# Patient Record
Sex: Female | Born: 1965 | Race: White | Hispanic: No | Marital: Married | Smoking: Former smoker
Health system: Southern US, Community
[De-identification: ages and names within clinical notes are randomized; demographics above are authoritative.]

## PROBLEM LIST (undated history)

## (undated) DIAGNOSIS — G4730 Sleep apnea, unspecified: Secondary | ICD-10-CM

## (undated) DIAGNOSIS — E119 Type 2 diabetes mellitus without complications: Secondary | ICD-10-CM

## (undated) DIAGNOSIS — I10 Essential (primary) hypertension: Secondary | ICD-10-CM

## (undated) DIAGNOSIS — IMO0001 Reserved for inherently not codable concepts without codable children: Secondary | ICD-10-CM

## (undated) DIAGNOSIS — F419 Anxiety disorder, unspecified: Secondary | ICD-10-CM

## (undated) HISTORY — PX: APPENDECTOMY: SHX54

---

## 2010-11-09 ENCOUNTER — Other Ambulatory Visit (HOSPITAL_COMMUNITY)
Admission: RE | Admit: 2010-11-09 | Discharge: 2010-11-09 | Disposition: A | Discharge status: Home / Self Care | Payer: 59 | Source: Ambulatory Visit | Attending: Family Medicine | Admitting: Family Medicine

## 2010-11-09 DIAGNOSIS — Z124 Encounter for screening for malignant neoplasm of cervix: Secondary | ICD-10-CM | POA: Insufficient documentation

## 2013-11-15 ENCOUNTER — Other Ambulatory Visit (HOSPITAL_COMMUNITY)
Admission: RE | Admit: 2013-11-15 | Discharge: 2013-11-15 | Disposition: A | Discharge status: Home / Self Care | Payer: 59 | Source: Ambulatory Visit | Attending: Family Medicine | Admitting: Family Medicine

## 2013-11-15 ENCOUNTER — Other Ambulatory Visit: Payer: Self-pay | Admitting: Family Medicine

## 2013-11-15 DIAGNOSIS — Z1151 Encounter for screening for human papillomavirus (HPV): Secondary | ICD-10-CM | POA: Insufficient documentation

## 2013-11-15 DIAGNOSIS — Z124 Encounter for screening for malignant neoplasm of cervix: Secondary | ICD-10-CM | POA: Insufficient documentation

## 2014-01-09 ENCOUNTER — Ambulatory Visit: Payer: 59

## 2014-01-16 ENCOUNTER — Ambulatory Visit: Payer: 59

## 2014-01-23 ENCOUNTER — Ambulatory Visit: Payer: 59

## 2015-06-18 ENCOUNTER — Other Ambulatory Visit: Payer: Self-pay | Admitting: Family Medicine

## 2015-06-18 ENCOUNTER — Other Ambulatory Visit (HOSPITAL_COMMUNITY)
Admission: RE | Admit: 2015-06-18 | Discharge: 2015-06-18 | Disposition: A | Discharge status: Home / Self Care | Payer: Managed Care, Other (non HMO) | Source: Ambulatory Visit | Attending: Family Medicine | Admitting: Family Medicine

## 2015-06-18 DIAGNOSIS — Z01411 Encounter for gynecological examination (general) (routine) with abnormal findings: Secondary | ICD-10-CM | POA: Insufficient documentation

## 2015-06-23 ENCOUNTER — Other Ambulatory Visit: Payer: Self-pay | Admitting: Family Medicine

## 2015-06-23 DIAGNOSIS — D259 Leiomyoma of uterus, unspecified: Secondary | ICD-10-CM

## 2015-06-23 LAB — CYTOLOGY - PAP

## 2015-06-25 ENCOUNTER — Ambulatory Visit
Admission: RE | Admit: 2015-06-25 | Discharge: 2015-06-25 | Disposition: A | Discharge status: Home / Self Care | Payer: Managed Care, Other (non HMO) | Source: Ambulatory Visit | Attending: Family Medicine | Admitting: Family Medicine

## 2015-06-25 DIAGNOSIS — D259 Leiomyoma of uterus, unspecified: Secondary | ICD-10-CM

## 2016-01-07 NOTE — Patient Instructions (Addendum)
Your procedure is scheduled on:  Friday, January 22, 2016  Enter through the [employer] [facility] at: 11:00 AM  Pick up the phone at the desk and dial [phone].  Call this number if you have problems the morning of surgery: [phone].  Remember: Do NOT eat food:  After midnight Thursday, January 21, 2016  Do NOT drink clear liquids after: 8:30 AM day of surgery  Take these medicines the morning of surgery with a SIP OF WATER:  Do NOT wear jewelry (body piercing), metal hair clips/bobby pins, make-up, or nail polish. Do NOT wear lotions, powders, or perfumes.  You may wear deodorant. Do NOT shave for 48 hours prior to surgery. Do NOT bring valuables to the hospital. Contacts, dentures, or bridgework may not be worn into surgery.  Have a responsible adult drive you home and stay with you for 24 hours after your procedure

## 2016-01-11 ENCOUNTER — Other Ambulatory Visit: Payer: Self-pay

## 2016-01-11 ENCOUNTER — Encounter (HOSPITAL_COMMUNITY)
Admission: RE | Admit: 2016-01-11 | Discharge: 2016-01-11 | Disposition: A | Discharge status: Home / Self Care | Payer: Managed Care, Other (non HMO) | Source: Ambulatory Visit | Attending: Obstetrics and Gynecology | Admitting: Obstetrics and Gynecology

## 2016-01-11 ENCOUNTER — Other Ambulatory Visit: Payer: Self-pay | Admitting: Obstetrics and Gynecology

## 2016-01-11 ENCOUNTER — Encounter (HOSPITAL_COMMUNITY): Payer: Self-pay

## 2016-01-11 DIAGNOSIS — Z01810 Encounter for preprocedural cardiovascular examination: Secondary | ICD-10-CM | POA: Diagnosis present

## 2016-01-11 DIAGNOSIS — Z01812 Encounter for preprocedural laboratory examination: Secondary | ICD-10-CM | POA: Diagnosis present

## 2016-01-11 HISTORY — DX: Essential (primary) hypertension: I10

## 2016-01-11 HISTORY — DX: Type 2 diabetes mellitus without complications: E11.9

## 2016-01-11 HISTORY — DX: Reserved for inherently not codable concepts without codable children: IMO0001

## 2016-01-11 HISTORY — DX: Sleep apnea, unspecified: G47.30

## 2016-01-11 HISTORY — DX: Anxiety disorder, unspecified: F41.9

## 2016-01-11 LAB — BASIC METABOLIC PANEL
Anion gap: 9 (ref 5–15)
BUN: 11 mg/dL (ref 6–20)
CO2: 27 mmol/L (ref 22–32)
CREATININE: 0.65 mg/dL (ref 0.44–1.00)
Calcium: 8.7 mg/dL — ABNORMAL LOW (ref 8.9–10.3)
Chloride: 99 mmol/L — ABNORMAL LOW (ref 101–111)
GFR calc Af Amer: >60 mL/min (ref >60)
GLUCOSE: 190 mg/dL — AB (ref 65–99)
POTASSIUM: 3 mmol/L — AB (ref 3.5–5.1)
SODIUM: 135 mmol/L (ref 135–145)

## 2016-01-11 LAB — CBC
HEMATOCRIT: 35.8 % — AB (ref 36.0–46.0)
Hemoglobin: 11.8 g/dL — ABNORMAL LOW (ref 12.0–15.0)
MCH: 28.7 pg (ref 26.0–34.0)
MCHC: 33 g/dL (ref 30.0–36.0)
MCV: 87.1 fL (ref 78.0–100.0)
PLATELETS: 267 10*3/uL (ref 150–400)
RBC: 4.11 MIL/uL (ref 3.87–5.11)
RDW: 14.1 % (ref 11.5–15.5)
WBC: 6.5 10*3/uL (ref 4.0–10.5)

## 2016-01-11 NOTE — Pre-Procedure Instructions (Signed)
BMP results routed to Dr. Harrington Challenger- made mention on route note that K+ result is low.

## 2016-01-11 NOTE — Pre-Procedure Instructions (Signed)
Dr. Ermalene Postin notified about patient's K+ result of 3.0 and he is ok with that- "anything above 2.5 is fine"

## 2016-01-18 NOTE — Anesthesia Preprocedure Evaluation (Addendum)
Anesthesia Evaluation  Patient identified by MRN, date of birth, ID band Patient awake    Reviewed: Allergy & Precautions, NPO status , Patient's Chart, lab work & pertinent test results  Airway Mallampati: II   Neck ROM: Full    Dental  (+) Teeth Intact, Dental Advisory Given   Pulmonary neg pulmonary ROS, sleep apnea , former smoker (quit 1986),    breath sounds clear to auscultation       Cardiovascular hypertension, Pt. on medications negative cardio ROS   Rhythm:Regular     Neuro/Psych Anxiety negative neurological ROS  negative psych ROS   GI/Hepatic negative GI ROS, Neg liver ROS,   Endo/Other  negative endocrine ROSdiabetesMorbid obesityBMI 41  Renal/GU negative Renal ROS  negative genitourinary   Musculoskeletal negative musculoskeletal ROS (+)   Abdominal   Peds negative pediatric ROS (+)  Hematology negative hematology ROS (+)   Anesthesia Other Findings   Reproductive/Obstetrics negative OB ROS                            Anesthesia Physical Anesthesia Plan  ASA: II  Anesthesia Plan: General   Post-op Pain Management:    Induction: Intravenous  Airway Management Planned: LMA and Oral ETT  Additional Equipment:   Intra-op Plan:   Post-operative Plan: Extubation in OR  Informed Consent: I have reviewed the patients History and Physical, chart, labs and discussed the procedure including the risks, benefits and alternatives for the proposed anesthesia with the patient or authorized representative who has indicated his/her understanding and acceptance.     Plan Discussed with:   Anesthesia Plan Comments: (Check am potassium  )        Anesthesia Quick Evaluation

## 2016-01-22 ENCOUNTER — Other Ambulatory Visit: Payer: Self-pay | Admitting: Obstetrics and Gynecology

## 2016-01-22 ENCOUNTER — Ambulatory Visit (HOSPITAL_COMMUNITY): Payer: Managed Care, Other (non HMO) | Admitting: Anesthesiology

## 2016-01-22 ENCOUNTER — Ambulatory Visit (HOSPITAL_COMMUNITY)
Admission: RE | Admit: 2016-01-22 | Discharge: 2016-01-22 | Disposition: A | Discharge status: Home / Self Care | Payer: Managed Care, Other (non HMO) | Source: Ambulatory Visit | Attending: Obstetrics and Gynecology | Admitting: Obstetrics and Gynecology

## 2016-01-22 ENCOUNTER — Encounter (HOSPITAL_COMMUNITY): Payer: Self-pay

## 2016-01-22 ENCOUNTER — Encounter (HOSPITAL_COMMUNITY)
Admission: RE | Disposition: A | Discharge status: Home / Self Care | Payer: Self-pay | Source: Ambulatory Visit | Attending: Obstetrics and Gynecology

## 2016-01-22 DIAGNOSIS — G4730 Sleep apnea, unspecified: Secondary | ICD-10-CM | POA: Diagnosis not present

## 2016-01-22 DIAGNOSIS — Z6841 Body Mass Index (BMI) 40.0 and over, adult: Secondary | ICD-10-CM | POA: Insufficient documentation

## 2016-01-22 DIAGNOSIS — Z87891 Personal history of nicotine dependence: Secondary | ICD-10-CM | POA: Diagnosis not present

## 2016-01-22 DIAGNOSIS — I10 Essential (primary) hypertension: Secondary | ICD-10-CM | POA: Insufficient documentation

## 2016-01-22 DIAGNOSIS — F419 Anxiety disorder, unspecified: Secondary | ICD-10-CM | POA: Diagnosis not present

## 2016-01-22 DIAGNOSIS — E119 Type 2 diabetes mellitus without complications: Secondary | ICD-10-CM | POA: Insufficient documentation

## 2016-01-22 DIAGNOSIS — N921 Excessive and frequent menstruation with irregular cycle: Secondary | ICD-10-CM | POA: Insufficient documentation

## 2016-01-22 HISTORY — PX: HYSTEROSCOPY: SHX211

## 2016-01-22 LAB — BASIC METABOLIC PANEL
Anion gap: 8 (ref 5–15)
BUN: 13 mg/dL (ref 6–20)
CHLORIDE: 98 mmol/L — AB (ref 101–111)
CO2: 27 mmol/L (ref 22–32)
CREATININE: 0.62 mg/dL (ref 0.44–1.00)
Calcium: 8.9 mg/dL (ref 8.9–10.3)
GFR calc non Af Amer: >60 mL/min (ref >60)
Glucose, Bld: 103 mg/dL — ABNORMAL HIGH (ref 65–99)
POTASSIUM: 3.6 mmol/L (ref 3.5–5.1)
SODIUM: 133 mmol/L — AB (ref 135–145)

## 2016-01-22 LAB — GLUCOSE, CAPILLARY: Glucose-Capillary: 90 mg/dL (ref 65–99)

## 2016-01-22 SURGERY — HYSTEROSCOPY
Anesthesia: General

## 2016-01-22 MED ORDER — KETOROLAC TROMETHAMINE 30 MG/ML IJ SOLN
30.0000 mg | Freq: Once | INTRAMUSCULAR | Status: AC
  Administered 2016-01-22: 30 mg via INTRAMUSCULAR

## 2016-01-22 MED ORDER — LIDOCAINE HCL (CARDIAC) 20 MG/ML IV SOLN
INTRAVENOUS | Status: AC
  Filled 2016-01-22: qty 5

## 2016-01-22 MED ORDER — SODIUM CHLORIDE 0.9 % IR SOLN
Status: DC | PRN
  Administered 2016-01-22 (×2): 3000 mL

## 2016-01-22 MED ORDER — HYDROCODONE-ACETAMINOPHEN 5-325 MG PO TABS
ORAL_TABLET | ORAL | Status: DC
Start: 2016-01-22 — End: 2016-01-22
  Filled 2016-01-22: qty 1

## 2016-01-22 MED ORDER — FENTANYL CITRATE (PF) 100 MCG/2ML IJ SOLN
INTRAMUSCULAR | Status: AC
  Filled 2016-01-22: qty 2

## 2016-01-22 MED ORDER — KETOROLAC TROMETHAMINE 30 MG/ML IJ SOLN
INTRAMUSCULAR | Status: DC | PRN
  Administered 2016-01-22: 30 mg via INTRAVENOUS

## 2016-01-22 MED ORDER — EPHEDRINE SULFATE 50 MG/ML IJ SOLN
INTRAMUSCULAR | Status: DC | PRN
  Administered 2016-01-22: 10 mg via INTRAVENOUS
  Administered 2016-01-22: 15 mg via INTRAVENOUS

## 2016-01-22 MED ORDER — ONDANSETRON HCL 4 MG/2ML IJ SOLN
INTRAMUSCULAR | Status: AC
  Filled 2016-01-22: qty 2

## 2016-01-22 MED ORDER — ACETAMINOPHEN 500 MG PO TABS
500.0000 mg | ORAL_TABLET | Freq: Once | ORAL | Status: AC
  Administered 2016-01-22: 500 mg via ORAL

## 2016-01-22 MED ORDER — MEPERIDINE HCL 25 MG/ML IJ SOLN: 6.2500 mg | INTRAMUSCULAR | Status: DC | PRN

## 2016-01-22 MED ORDER — DEXAMETHASONE SODIUM PHOSPHATE 10 MG/ML IJ SOLN
INTRAMUSCULAR | Status: DC | PRN
  Administered 2016-01-22: 4 mg via INTRAVENOUS

## 2016-01-22 MED ORDER — FENTANYL CITRATE (PF) 100 MCG/2ML IJ SOLN
25.0000 ug | INTRAMUSCULAR | Status: DC | PRN
  Administered 2016-01-22 (×3): 50 ug via INTRAVENOUS

## 2016-01-22 MED ORDER — HYDROCODONE-ACETAMINOPHEN 5-325 MG PO TABS: ORAL_TABLET | ORAL | Status: AC

## 2016-01-22 MED ORDER — MIDAZOLAM HCL 2 MG/2ML IJ SOLN
INTRAMUSCULAR | Status: DC | PRN
  Administered 2016-01-22: 2 mg via INTRAVENOUS

## 2016-01-22 MED ORDER — DEXAMETHASONE SODIUM PHOSPHATE 4 MG/ML IJ SOLN
INTRAMUSCULAR | Status: AC
  Filled 2016-01-22: qty 1

## 2016-01-22 MED ORDER — ONDANSETRON HCL 4 MG/2ML IJ SOLN
INTRAMUSCULAR | Status: DC | PRN
Start: 2016-01-22 — End: 2016-01-22
  Administered 2016-01-22: 4 mg via INTRAVENOUS

## 2016-01-22 MED ORDER — SCOPOLAMINE 1 MG/3DAYS TD PT72
1.0000 | MEDICATED_PATCH | Freq: Once | TRANSDERMAL | Status: DC
  Administered 2016-01-22: 1.5 mg via TRANSDERMAL

## 2016-01-22 MED ORDER — FENTANYL CITRATE (PF) 100 MCG/2ML IJ SOLN
INTRAMUSCULAR | Status: DC | PRN
  Administered 2016-01-22 (×2): 50 ug via INTRAVENOUS

## 2016-01-22 MED ORDER — FENTANYL CITRATE (PF) 100 MCG/2ML IJ SOLN
INTRAMUSCULAR | Status: AC
  Administered 2016-01-22: 50 ug via INTRAVENOUS
  Filled 2016-01-22: qty 2

## 2016-01-22 MED ORDER — LIDOCAINE HCL 1 % IJ SOLN
INTRAMUSCULAR | Status: AC
  Filled 2016-01-22: qty 20

## 2016-01-22 MED ORDER — IBUPROFEN 600 MG PO TABS: 600.0000 mg | ORAL_TABLET | Freq: Four times a day (QID) | ORAL | Status: AC | PRN

## 2016-01-22 MED ORDER — PROPOFOL 10 MG/ML IV BOLUS
INTRAVENOUS | Status: AC
  Filled 2016-01-22: qty 20

## 2016-01-22 MED ORDER — LIDOCAINE HCL (CARDIAC) 20 MG/ML IV SOLN
INTRAVENOUS | Status: DC | PRN
  Administered 2016-01-22: 30 mg via INTRAVENOUS
  Administered 2016-01-22: 70 mg via INTRAVENOUS

## 2016-01-22 MED ORDER — HYDROCODONE-ACETAMINOPHEN 5-325 MG PO TABS
1.0000 | ORAL_TABLET | Freq: Once | ORAL | Status: AC
  Administered 2016-01-22: 1 via ORAL

## 2016-01-22 MED ORDER — PROPOFOL 10 MG/ML IV BOLUS
INTRAVENOUS | Status: DC | PRN
  Administered 2016-01-22: 170 mg via INTRAVENOUS

## 2016-01-22 MED ORDER — KETOROLAC TROMETHAMINE 30 MG/ML IJ SOLN
INTRAMUSCULAR | Status: AC
  Filled 2016-01-22: qty 1

## 2016-01-22 MED ORDER — LIDOCAINE HCL 1 % IJ SOLN
INTRAMUSCULAR | Status: DC | PRN
  Administered 2016-01-22: 10 mL

## 2016-01-22 MED ORDER — PROMETHAZINE HCL 25 MG/ML IJ SOLN
6.2500 mg | INTRAMUSCULAR | Status: DC | PRN
Start: 2016-01-22 — End: 2016-01-22

## 2016-01-22 MED ORDER — MIDAZOLAM HCL 2 MG/2ML IJ SOLN
INTRAMUSCULAR | Status: AC
  Filled 2016-01-22: qty 2

## 2016-01-22 MED ORDER — ACETAMINOPHEN 500 MG PO TABS
ORAL_TABLET | ORAL | Status: AC
  Administered 2016-01-22: 500 mg via ORAL
  Filled 2016-01-22: qty 1

## 2016-01-22 MED ORDER — LACTATED RINGERS IV SOLN
INTRAVENOUS | Status: DC
  Administered 2016-01-22 (×2): via INTRAVENOUS

## 2016-01-22 MED ORDER — SCOPOLAMINE 1 MG/3DAYS TD PT72
MEDICATED_PATCH | TRANSDERMAL | Status: AC
  Filled 2016-01-22: qty 1

## 2016-01-22 MED ORDER — LACTATED RINGERS IV SOLN: INTRAVENOUS | Status: DC

## 2016-01-22 MED ORDER — KETOROLAC TROMETHAMINE 30 MG/ML IJ SOLN
INTRAMUSCULAR | Status: AC
  Administered 2016-01-22: 30 mg via INTRAMUSCULAR
  Filled 2016-01-22: qty 1

## 2016-01-22 MED ORDER — EPHEDRINE 5 MG/ML INJ
INTRAVENOUS | Status: AC
  Filled 2016-01-22: qty 10

## 2016-01-22 SURGICAL SUPPLY — 24 items
ABLATOR ENDOMETRIAL BIPOLAR (ABLATOR) IMPLANT
CANISTER SUCT 3000ML (MISCELLANEOUS) ×3 IMPLANT
CATH FOLEY LATEX FREE 14FR (CATHETERS) ×2
CATH FOLEY LF 14FR (CATHETERS) ×1 IMPLANT
CATH ROBINSON RED A/P 16FR (CATHETERS) IMPLANT
CLOTH BEACON ORANGE TIMEOUT ST (SAFETY) ×3 IMPLANT
CONTAINER PREFILL 10% NBF 60ML (FORM) ×6 IMPLANT
DILATOR CANAL MILEX (MISCELLANEOUS) ×3 IMPLANT
ELECT REM PT RETURN 9FT ADLT (ELECTROSURGICAL)
ELECTRODE REM PT RTRN 9FT ADLT (ELECTROSURGICAL) IMPLANT
GLOVE BIO SURGEON STRL SZ7 (GLOVE) IMPLANT
GLOVE BIOGEL PI IND STRL 7.0 (GLOVE) ×2 IMPLANT
GLOVE BIOGEL PI INDICATOR 7.0 (GLOVE) ×4
GLOVE SKINSENSE NS SZ7.0 (GLOVE) ×4
GLOVE SKINSENSE STRL SZ7.0 (GLOVE) ×2 IMPLANT
GOWN STRL REUS W/TWL LRG LVL3 (GOWN DISPOSABLE) ×9 IMPLANT
NS IRRIG 1000ML POUR BTL (IV SOLUTION) ×3 IMPLANT
PACK VAGINAL MINOR WOMEN LF (CUSTOM PROCEDURE TRAY) ×3 IMPLANT
PAD OB MATERNITY 4.3X12.25 (PERSONAL CARE ITEMS) ×6 IMPLANT
SET GENESYS HTA PROCERVA (MISCELLANEOUS) ×3 IMPLANT
TOWEL OR 17X24 6PK STRL BLUE (TOWEL DISPOSABLE) ×6 IMPLANT
TUBING AQUILEX INFLOW (TUBING) ×3 IMPLANT
TUBING AQUILEX OUTFLOW (TUBING) ×3 IMPLANT
WATER STERILE IRR 1000ML POUR (IV SOLUTION) IMPLANT

## 2016-01-22 NOTE — Op Note (Signed)
Pre-Operative Diagnosis: 1) Menometrorrhagia Postoperative diagnoses:1) Menometrorrhagia Procedure:ysteroscopy, dilation and curettage, hydrothermal endometrial ablation Surgeon: Dr. Vanessa Kick Asst.: None Operative findings: Narrow pubic arch, smooth-walled endometrial cavity without polyps. Bilateral tubal ostia were noted Elongated cervix Fluid deficit 100 cc.  Specimen: endometrial curettings EBL: minimal Procedure: Tina Delacruz is a 50 year old female with a history of painful periods that are sometimes heavy. She has a known posterior intramural fibroid approximately 7 cm. She desired minimally invasive surgical management of er heavy painful periods. Please see the admission H&P for the full history. Due to the patient's pelvic anatomy and narrow pubic arch endometrial sampling could not be obtained in the office. Risks/benefits/alternatives of the procedure were discussed with the patient at length and she wished to proceed. Follow the appropriate informed consent the patient was taken to the operating room where general anesthesia was administered. She was placed in the dorsal lithotomy position. She was appropriately identified in a preoperative timeout procedure prior to initiating the case. He was prepped and draped in the normal sterile fashion. A speculum was placed in the vagina. A single-tooth tenaculum was first used to grasp the anterior lip of the cervix and then a second single-tooth tenaculum was required to grasp the posterior lip of the cervix. A paracervical block with 10 cc of 1% lidocaine was administered. The single-tooth tenaculum was removed from the anterior lip of the cervix and the cervix was serially dilated with Hanks dilators. The hysteroscope was introduced. Due to the patient's posterior uterine fibroid a 45 angle with the hysteroscope was required to enter the endometrial cavity. Once in the endometrial cavity the above findings were noted. Given the cervical length and  what appeared to be an elongated endometrial cavity it was felt that a hydrothermal ablation would have the best success for endometrial ablation. The hydrothermal device was then inserted through the cervix under direct visualization. A cavity assessment was performed and minimal leak of fluid was noted. The hydrothermal device was then initiated and the ablation procedure lasted for 10 minutes and was performed under direct visualization. When complete, an adequate endometrial ablation was noted. The hydrothermal device was removed. The single-tooth tenaculum was removed from the anterior lip of the cervix and the speculum was removed from the vagina. This completed the surgical procedure. The patient was transferred to the PACU following the procedure.

## 2016-01-22 NOTE — H&P (Signed)
Tina Delacruz is an 50 y.o. female.  50 yo presents for hysteroscopy, dilation and curettage for menometrorrhagia. The patient has regular periods that have become more painful over the years. She has a known posterior intramural fibroid. An attempt at sonohystogram was performed in the office however due to anatomy this could not be comfortably performed. THe decision was made to proceed with a hysteroscopy, dilation and curettage and attempt endometrial ablation. R/B/A of the procedure were discussed with the patient at length and she wishes to proceed.   Patient's last menstrual period was 01/09/2016.    Past Medical History  Diagnosis Date  . Diabetes mellitus without complication ([place])     type 2  . Hypertension   . Sleep apnea   . Shortness of breath dyspnea     with bronchitis  . Anxiety     Past Surgical History  Procedure Laterality Date  . Cesarean section      x 2  . Appendectomy      No family history on file.  Social History:  reports that she quit smoking about 31 years ago. Her smoking use included Cigarettes. She does not have any smokeless tobacco history on file. She reports that she does not drink alcohol or use illicit drugs.  Allergies: No Known Allergies   (Not in a hospital admission)  ROS  Last menstrual period 01/09/2016. Physical Exam   AOx3, NAD Normal work of breathing Abd soft  No results found for this or any previous visit (from the past 24 hour(s)).    No results found.  Assessment/Plan: 1) Admit 2) Hysteroscopy, dilation and curettage with attempt at endometrial ablation  Montrelle Eddings H. 01/22/2016, 9:02 AM

## 2016-01-22 NOTE — Anesthesia Procedure Notes (Signed)
Procedure Name: LMA Insertion Date/Time: 01/22/2016 11:51 AM Performed by: Tobin Chad Pre-anesthesia Checklist: Patient identified, Emergency Drugs available, Suction available, Timeout performed and Patient being monitored Patient Re-evaluated:Patient Re-evaluated prior to inductionOxygen Delivery Method: Circle system utilized and Simple face mask Preoxygenation: Pre-oxygenation with 100% oxygen Intubation Type: IV induction Ventilation: Mask ventilation without difficulty LMA Size: 4.0 Grade View: Grade II Number of attempts: 1 Placement Confirmation: positive ETCO2 and breath sounds checked- equal and bilateral Dental Injury: Teeth and Oropharynx as per pre-operative assessment

## 2016-01-22 NOTE — Transfer of Care (Signed)
Immediate Anesthesia Transfer of Care Note  Patient: Tina Delacruz  Procedure(s) Performed: Procedure(s): HYSTEROSCOPY (N/A) HYSTEROSCOPY, D&C, WITH HYDROTHERMAL ABLATION (N/A)  Patient Location: PACU  Anesthesia Type:General  Level of Consciousness: awake, alert , oriented and patient cooperative  Airway & Oxygen Therapy: Patient Spontanous Breathing and Patient connected to nasal cannula oxygen  Post-op Assessment: Report given to RN and Post -op Vital signs reviewed and stable  Post vital signs: Reviewed and stable  Last Vitals:  Filed Vitals:   01/22/16 1111  BP: 132/79  Pulse: 70  Temp: 36.8 C  Resp: 22    Last Pain: There were no vitals filed for this visit.    Patients Stated Pain Goal: 4 ([id number])  Complications: No apparent anesthesia complications

## 2016-01-22 NOTE — Anesthesia Postprocedure Evaluation (Signed)
Anesthesia Post Note  Patient: Tina Delacruz  Procedure(s) Performed: Procedure(s) (LRB): HYSTEROSCOPY (N/A) HYSTEROSCOPY, D&C, WITH HYDROTHERMAL ABLATION (N/A)  Patient location during evaluation: PACU Anesthesia Type: General Level of consciousness: awake and alert Pain management: pain level controlled Vital Signs Assessment: post-procedure vital signs reviewed and stable Respiratory status: spontaneous breathing, nonlabored ventilation, respiratory function stable and patient connected to nasal cannula oxygen Cardiovascular status: blood pressure returned to baseline and stable Postop Assessment: no signs of nausea or vomiting Anesthetic complications: no     Last Vitals:  Filed Vitals:   01/22/16 1111 01/22/16 1253  BP: 132/79 116/58  Pulse: 70 87  Temp: 36.8 C 36.6 C  Resp: 22 14    Last Pain: There were no vitals filed for this visit. Pain Goal: Patients Stated Pain Goal: 4 (01/22/16 1111)               [name]

## 2016-01-22 NOTE — Discharge Instructions (Signed)
DISCHARGE INSTRUCTIONS: HYSTEROSCOPY / ENDOMETRIAL ABLATION The following instructions have been prepared to help you care for yourself upon your return home.  May Remove Scop patch on or before 01/25/16   May take Ibuprofen after 6:10pm today.  May take stool softner while taking narcotic pain medication to prevent constipation.  Drink plenty of water.  Personal hygiene:  Use sanitary pads for vaginal drainage, not tampons.  Shower the day after your procedure.  NO tub baths, pools or Jacuzzis for 2-3 weeks.  Wipe front to back after using the bathroom.  Activity and limitations:  Do NOT drive or operate any equipment for 24 hours. The effects of anesthesia are still present and drowsiness may result.  Do NOT rest in bed all day.  Walking is encouraged.  Walk up and down stairs slowly.  You may resume your normal activity in one to two days or as indicated by your physician. Sexual activity: NO intercourse for at least 2 weeks after the procedure, or as indicated by your Doctor.  Diet: Eat a light meal as desired this evening. You may resume your usual diet tomorrow.  Return to Work: You may resume your work activities in one to two days or as indicated by Marine scientist.  What to expect after your surgery: Expect to have vaginal bleeding/discharge for 2-3 days and spotting for up to 10 days. It is not unusual to have soreness for up to 1-2 weeks. You may have a slight burning sensation when you urinate for the first day. Mild cramps may continue for a couple of days. You may have a regular period in 2-6 weeks.  Call your doctor for any of the following:  Excessive vaginal bleeding or clotting, saturating and changing one pad every hour.  Inability to urinate 6 hours after discharge from hospital.  Pain not relieved by pain medication.  Fever of 100.4 F or greater.  Unusual vaginal discharge or odor.  Return to office _________________Call for an appointment  ___________________ Patients signature: ______________________ Nurses signature ________________________  [facility] [phone]

## 2016-01-28 ENCOUNTER — Encounter (HOSPITAL_COMMUNITY): Payer: Self-pay | Admitting: Obstetrics and Gynecology

## 2018-08-24 DIAGNOSIS — M65342 Trigger finger, left ring finger: Secondary | ICD-10-CM | POA: Insufficient documentation

## 2019-03-15 ENCOUNTER — Other Ambulatory Visit (HOSPITAL_COMMUNITY)
Admission: RE | Admit: 2019-03-15 | Discharge: 2019-03-15 | Disposition: A | Discharge status: Home / Self Care | Payer: Managed Care, Other (non HMO) | Source: Ambulatory Visit | Attending: Family Medicine | Admitting: Family Medicine

## 2019-03-15 ENCOUNTER — Other Ambulatory Visit: Payer: Self-pay | Admitting: Family Medicine

## 2019-03-15 DIAGNOSIS — Z01411 Encounter for gynecological examination (general) (routine) with abnormal findings: Secondary | ICD-10-CM | POA: Diagnosis not present

## 2019-03-19 LAB — CYTOLOGY - PAP: Diagnosis: NEGATIVE

## 2019-07-01 ENCOUNTER — Other Ambulatory Visit: Payer: Self-pay | Admitting: Family Medicine

## 2019-07-01 DIAGNOSIS — K7581 Nonalcoholic steatohepatitis (NASH): Secondary | ICD-10-CM

## 2019-07-09 ENCOUNTER — Other Ambulatory Visit: Payer: Self-pay | Admitting: Family Medicine

## 2019-07-09 DIAGNOSIS — Z1231 Encounter for screening mammogram for malignant neoplasm of breast: Secondary | ICD-10-CM

## 2019-07-26 ENCOUNTER — Ambulatory Visit
Admission: RE | Admit: 2019-07-26 | Discharge: 2019-07-26 | Disposition: A | Discharge status: Home / Self Care | Payer: Managed Care, Other (non HMO) | Source: Ambulatory Visit | Attending: Family Medicine | Admitting: Family Medicine

## 2019-07-26 DIAGNOSIS — K7581 Nonalcoholic steatohepatitis (NASH): Secondary | ICD-10-CM

## 2019-08-19 HISTORY — PX: BREAST BIOPSY: SHX20

## 2019-08-23 ENCOUNTER — Other Ambulatory Visit: Payer: Self-pay

## 2019-08-23 ENCOUNTER — Ambulatory Visit
Admission: RE | Admit: 2019-08-23 | Discharge: 2019-08-23 | Disposition: A | Discharge status: Home / Self Care | Payer: Managed Care, Other (non HMO) | Source: Ambulatory Visit | Attending: Family Medicine | Admitting: Family Medicine

## 2019-08-23 DIAGNOSIS — Z1231 Encounter for screening mammogram for malignant neoplasm of breast: Secondary | ICD-10-CM

## 2019-08-28 ENCOUNTER — Other Ambulatory Visit: Payer: Self-pay | Admitting: Family Medicine

## 2019-08-28 DIAGNOSIS — R928 Other abnormal and inconclusive findings on diagnostic imaging of breast: Secondary | ICD-10-CM

## 2019-09-06 ENCOUNTER — Other Ambulatory Visit: Payer: Self-pay

## 2019-09-06 ENCOUNTER — Ambulatory Visit
Admission: RE | Admit: 2019-09-06 | Discharge: 2019-09-06 | Disposition: A | Discharge status: Home / Self Care | Payer: Managed Care, Other (non HMO) | Source: Ambulatory Visit | Attending: Family Medicine | Admitting: Family Medicine

## 2019-09-06 ENCOUNTER — Other Ambulatory Visit: Payer: Self-pay | Admitting: Family Medicine

## 2019-09-06 DIAGNOSIS — R928 Other abnormal and inconclusive findings on diagnostic imaging of breast: Secondary | ICD-10-CM

## 2019-09-06 DIAGNOSIS — R921 Mammographic calcification found on diagnostic imaging of breast: Secondary | ICD-10-CM

## 2019-09-11 ENCOUNTER — Ambulatory Visit
Admission: RE | Admit: 2019-09-11 | Discharge: 2019-09-11 | Disposition: A | Discharge status: Home / Self Care | Payer: Managed Care, Other (non HMO) | Source: Ambulatory Visit | Attending: Family Medicine | Admitting: Family Medicine

## 2019-09-11 ENCOUNTER — Other Ambulatory Visit: Payer: Self-pay

## 2019-09-11 DIAGNOSIS — R921 Mammographic calcification found on diagnostic imaging of breast: Secondary | ICD-10-CM

## 2020-04-02 IMAGING — MG MM BREAST BX W/ LOC DEV 1ST LESION IMAGE BX SPEC STEREO GUIDE*R*
7 of 11 series · 7 of 19 positions shown · non-contrast
Comparison: Previous exams.
COMPARISON: Previous exams.

Addendum:
CLINICAL DATA: 53-year-old female presenting for stereotactic
biopsy of 2 groups of right breast calcifications.

EXAM:
RIGHT BREAST STEREOTACTIC CORE NEEDLE BIOPSY

[R (1 of 6)]
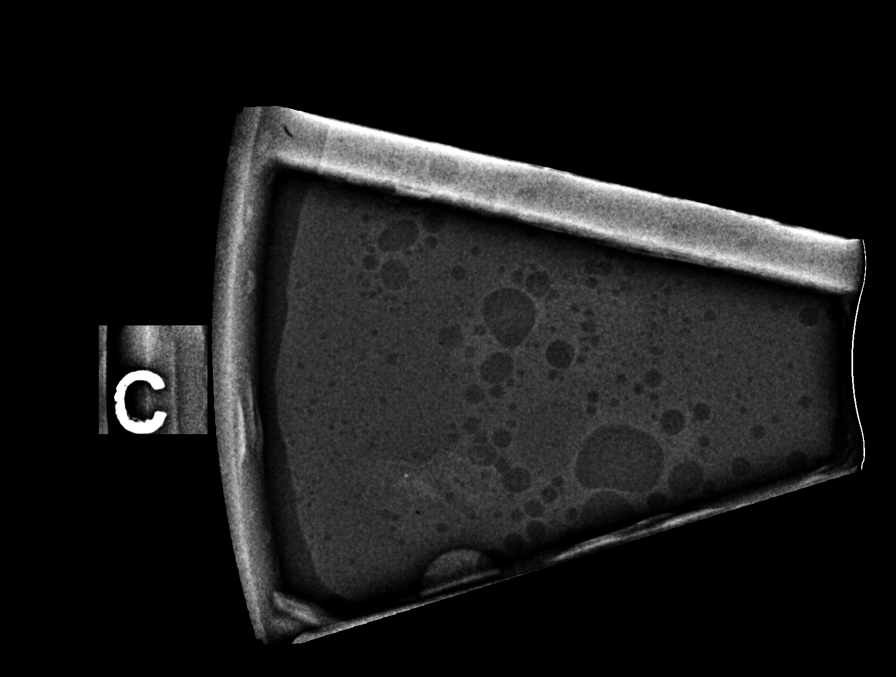

[R (2 of 6)]
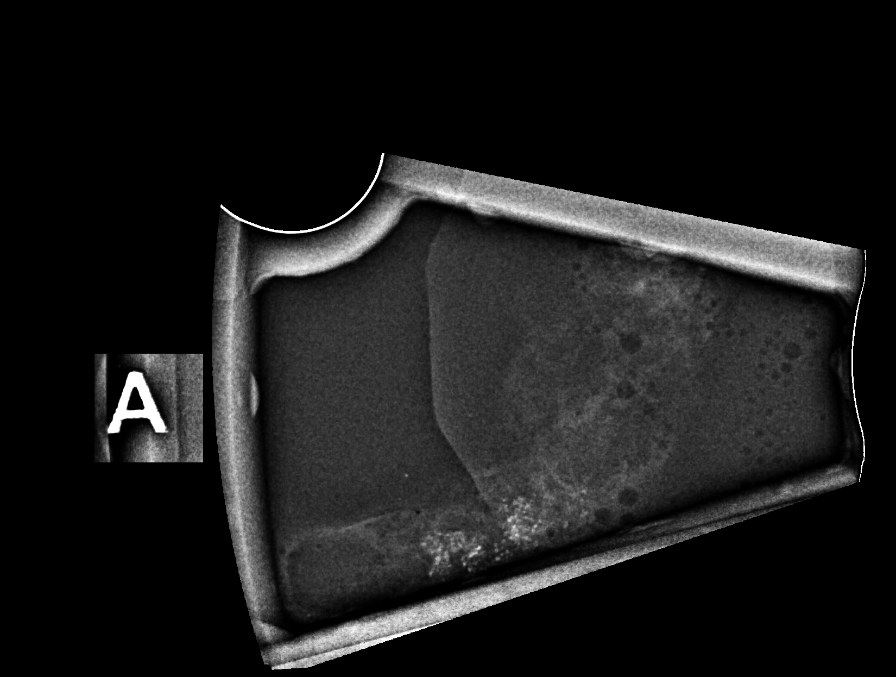

[R (3 of 6)]
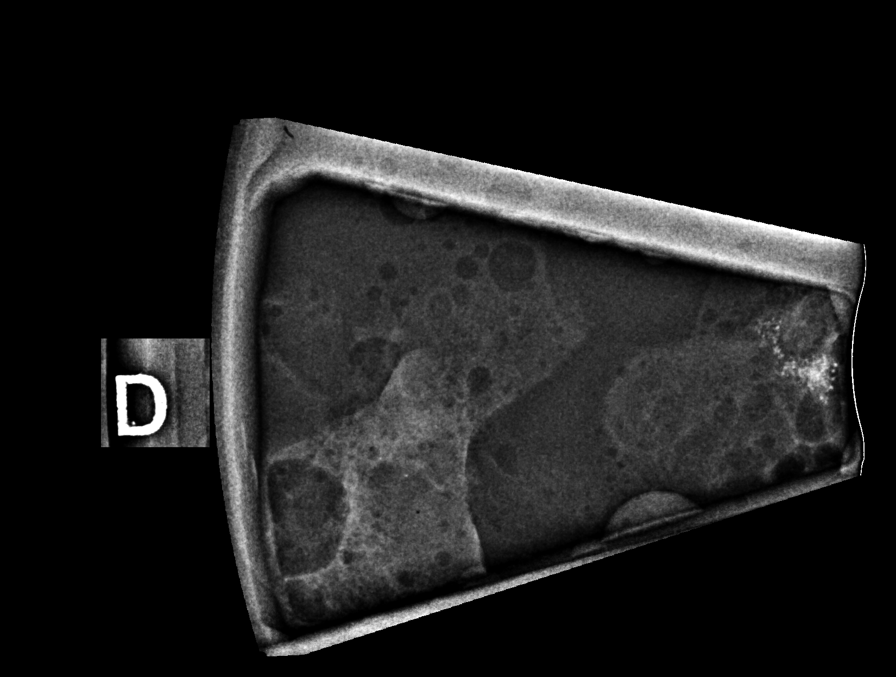

[R (4 of 6)]
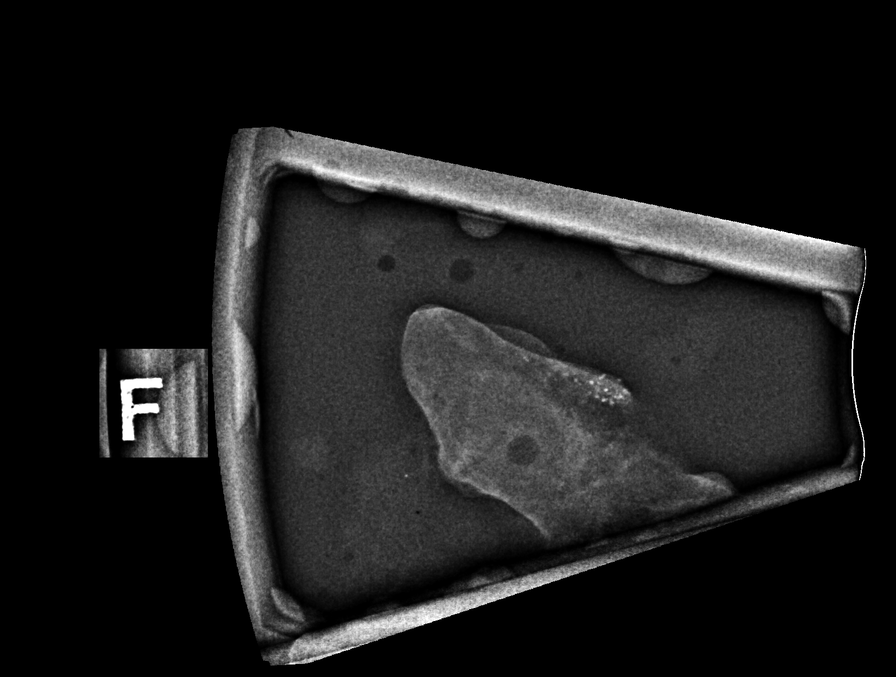

[R (5 of 6)]
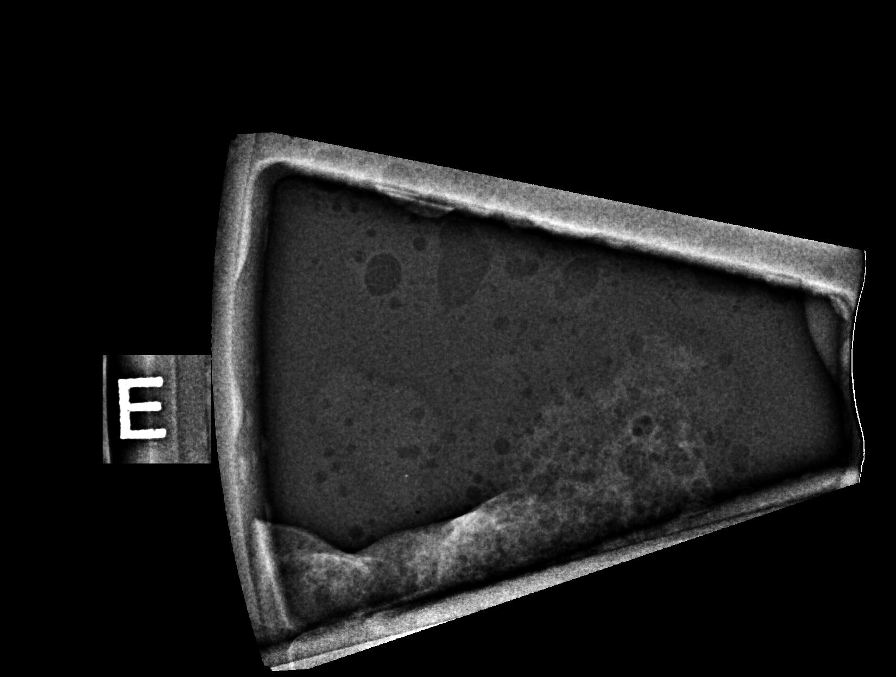

[R (6 of 6)]
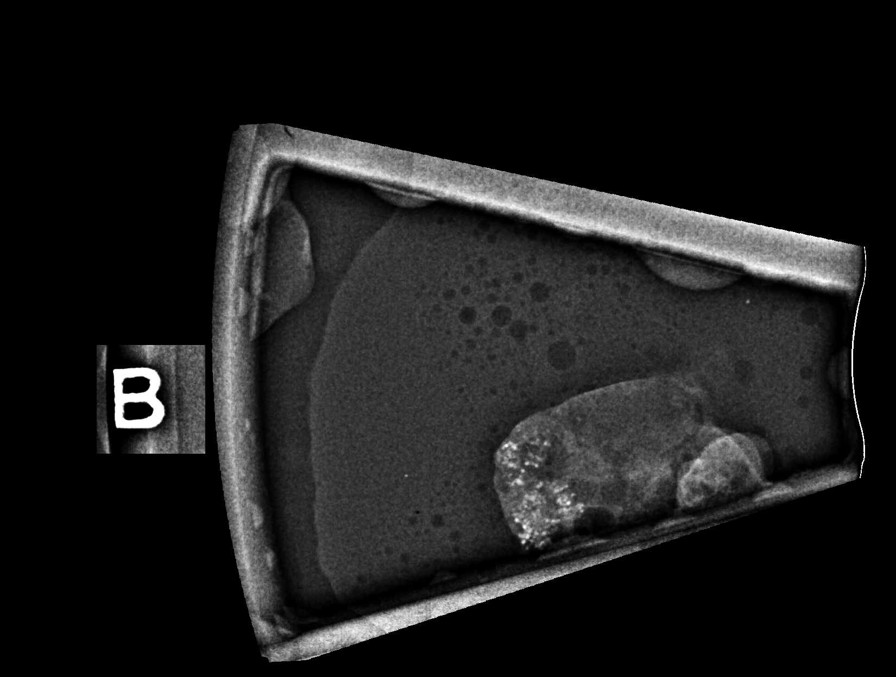

[R CC]
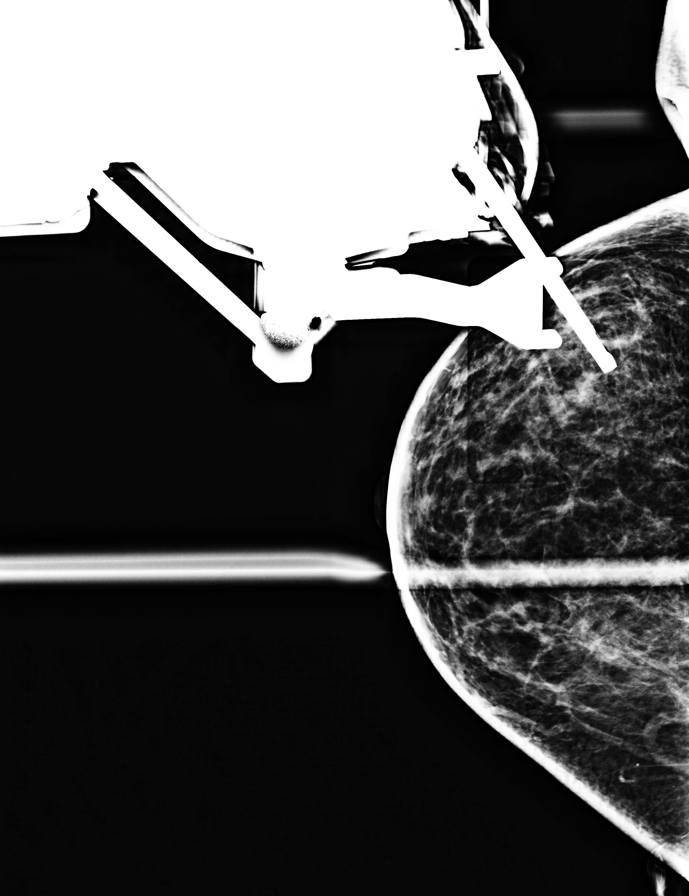

[7 of 19 positions shown; findings below may reference images not displayed]



Using sterile technique and 1% Lidocaine as local anesthetic, under
stereotactic guidance, a 9 gauge vacuum assisted device was used to
perform core needle biopsy of calcifications in the posterior upper
outer quadrant of the right breast using a superior approach.
Specimen radiograph was performed showing calcifications in multiple
core samples. Specimens with calcifications are identified for
pathology.

Lesion quadrant: Upper-outer quadrant

At the conclusion of the procedure, a coil shaped tissue marker clip
was deployed into the biopsy cavity.

----------------------------------------------------------

Using sterile technique and 1% Lidocaine as local anesthetic, under
stereotactic guidance, a 9 gauge vacuum assisted device was used to
perform core needle biopsy of calcifications in the anterior upper
outer quadrant of the right breast using a superior approach.
Specimen radiograph was performed showing calcifications in multiple
core samples. Specimens with calcifications are identified for
pathology.

Lesion quadrant: Upper-outer quadrant

At the conclusion of the procedure, an X shaped tissue marker clip
was deployed into the biopsy cavity. Follow-up 2-view mammogram was
performed and dictated separately.
IMPRESSION: 1. Stereotactic-guided biopsy of calcifications in the posterior
upper outer quadrant of the right breast. No apparent complications.

2. Stereotactic-guided biopsy of calcifications in the anterior
upper outer quadrant of the right breast. No apparent complications.

ADDENDUM:
Pathology revealed FIBROCYSTIC CHANGE WITH COLUMNAR CELL CHANGE AND
CALCIFICATIONS of the Right breast, both locations, posterior upper
outer and anterior upper outer quadrant. This was found to be
concordant by Dr. Abo Ghazi Shawa.

Pathology results were discussed with the patient by telephone. The
patient reported doing well after the biopsies with tenderness at
the sites. Post biopsy instructions and care were reviewed and
questions were answered. The patient was encouraged to call The

The patient was instructed to return for annual screening
mammography and informed a reminder notice would be sent regarding
this appointment.

Pathology results reported by Drea Ji, RN on 09/12/2019.



Using sterile technique and 1% Lidocaine as local anesthetic, under
stereotactic guidance, a 9 gauge vacuum assisted device was used to
perform core needle biopsy of calcifications in the posterior upper
outer quadrant of the right breast using a superior approach.
Specimen radiograph was performed showing calcifications in multiple
core samples. Specimens with calcifications are identified for
pathology.

Lesion quadrant: Upper-outer quadrant

At the conclusion of the procedure, a coil shaped tissue marker clip
was deployed into the biopsy cavity.

----------------------------------------------------------

Using sterile technique and 1% Lidocaine as local anesthetic, under
stereotactic guidance, a 9 gauge vacuum assisted device was used to
perform core needle biopsy of calcifications in the anterior upper
outer quadrant of the right breast using a superior approach.
Specimen radiograph was performed showing calcifications in multiple
core samples. Specimens with calcifications are identified for
pathology.

Lesion quadrant: Upper-outer quadrant

At the conclusion of the procedure, an X shaped tissue marker clip
was deployed into the biopsy cavity. Follow-up 2-view mammogram was
performed and dictated separately.
IMPRESSION: 1. Stereotactic-guided biopsy of calcifications in the posterior
upper outer quadrant of the right breast. No apparent complications.

2. Stereotactic-guided biopsy of calcifications in the anterior
upper outer quadrant of the right breast. No apparent complications.

## 2021-03-20 DIAGNOSIS — I10 Essential (primary) hypertension: Secondary | ICD-10-CM | POA: Insufficient documentation

## 2021-03-20 DIAGNOSIS — E785 Hyperlipidemia, unspecified: Secondary | ICD-10-CM | POA: Insufficient documentation

## 2021-08-06 ENCOUNTER — Other Ambulatory Visit (HOSPITAL_COMMUNITY): Payer: Self-pay

## 2021-08-06 MED ORDER — TRULICITY 4.5 MG/0.5ML ~~LOC~~ SOAJ
4.5000 mg | SUBCUTANEOUS | 1 refills | Status: AC
  Filled 2021-08-06 (×2): qty 2, 28d supply, fill #0

## 2021-08-06 MED ORDER — AZELASTINE HCL 0.15 % NA SOLN: 1.0000 | Freq: Every day | NASAL | 5 refills | Status: AC

## 2021-08-09 ENCOUNTER — Other Ambulatory Visit (HOSPITAL_COMMUNITY): Payer: Self-pay

## 2021-08-10 ENCOUNTER — Other Ambulatory Visit (HOSPITAL_COMMUNITY): Payer: Self-pay

## 2022-09-20 ENCOUNTER — Other Ambulatory Visit (HOSPITAL_COMMUNITY)
Admission: RE | Admit: 2022-09-20 | Discharge: 2022-09-20 | Disposition: A | Discharge status: Home / Self Care | Payer: Managed Care, Other (non HMO) | Source: Ambulatory Visit | Attending: Family Medicine | Admitting: Family Medicine

## 2022-09-20 ENCOUNTER — Other Ambulatory Visit: Payer: Self-pay | Admitting: Family Medicine

## 2022-09-20 DIAGNOSIS — Z01411 Encounter for gynecological examination (general) (routine) with abnormal findings: Secondary | ICD-10-CM | POA: Insufficient documentation

## 2022-09-27 LAB — CYTOLOGY - PAP
Comment: NEGATIVE
Diagnosis: NEGATIVE
High risk HPV: NEGATIVE

## 2022-11-21 ENCOUNTER — Other Ambulatory Visit (HOSPITAL_COMMUNITY): Payer: Self-pay

## 2022-11-21 MED ORDER — MOUNJARO 12.5 MG/0.5ML ~~LOC~~ SOAJ
12.5000 mg | SUBCUTANEOUS | 6 refills | Status: AC
  Filled 2022-11-21: qty 2, 28d supply, fill #0

## 2022-11-22 ENCOUNTER — Other Ambulatory Visit (HOSPITAL_COMMUNITY): Payer: Self-pay

## 2023-10-19 ENCOUNTER — Other Ambulatory Visit: Payer: Self-pay | Admitting: Family Medicine

## 2023-10-19 DIAGNOSIS — Z1231 Encounter for screening mammogram for malignant neoplasm of breast: Secondary | ICD-10-CM

## 2023-10-20 ENCOUNTER — Ambulatory Visit
Admission: RE | Admit: 2023-10-20 | Discharge: 2023-10-20 | Disposition: A | Discharge status: Home / Self Care | Source: Ambulatory Visit | Attending: Family Medicine | Admitting: Family Medicine

## 2023-10-20 DIAGNOSIS — Z1231 Encounter for screening mammogram for malignant neoplasm of breast: Secondary | ICD-10-CM

## 2023-10-26 ENCOUNTER — Ambulatory Visit (INDEPENDENT_AMBULATORY_CARE_PROVIDER_SITE_OTHER)

## 2023-10-26 ENCOUNTER — Encounter: Payer: Self-pay | Admitting: Podiatry

## 2023-10-26 ENCOUNTER — Ambulatory Visit (INDEPENDENT_AMBULATORY_CARE_PROVIDER_SITE_OTHER): Admitting: Podiatry

## 2023-10-26 DIAGNOSIS — L600 Ingrowing nail: Secondary | ICD-10-CM

## 2023-10-26 DIAGNOSIS — M21612 Bunion of left foot: Secondary | ICD-10-CM

## 2023-10-26 DIAGNOSIS — M21611 Bunion of right foot: Secondary | ICD-10-CM

## 2023-10-27 NOTE — Progress Notes (Signed)
 Subjective:   Patient ID: Tina Delacruz, female   DOB: 58 y.o.   MRN: [id number]   HPI Patient presents with concerns about bunion formation right discomfort in the feet and also a chronic ingrown toenail deformity left over right big toe that becomes painful and makes shoe gear at times difficult with pedicures who can make some difference for her.  Patient does not smoke likes to be active   Review of Systems  All other systems reviewed and are negative.       Objective:  Physical Exam Vitals and nursing note reviewed.  Constitutional:      Appearance: She is well-developed.  Pulmonary:     Effort: Pulmonary effort is normal.  Musculoskeletal:        General: Normal range of motion.  Skin:    General: Skin is warm.  Neurological:     Mental Status: She is alert.     Neurovascular status found to be intact muscle strength found to be adequate range of motion adequate with patient noted to have mild structural bunion deformity right but not significant bony deformity along with incurvation of the medial border of the left big toe painful when pressed making shoe gear difficult with moderate discomfort of the right big toe     Assessment:  Possibility for mild structural bunion deformity right over left along with ingrown toenail deformity hallux left over right painful history diabetes under excellent control A1c normal currently     Plan:  H&P reviewed discussed both conditions and I do think correction of ingrown toenail is warranted and she wants to do this but needs to wait till next week.  Educated her on procedure and recovery do not recommend treatment for bunion I think it is a small acute condition and should resolve with shoe gear modifications and soaks as needed  X-rays were negative for signs of significant bunion deformity mild irritation around the first metatarsal head right

## 2023-10-30 ENCOUNTER — Encounter: Payer: Self-pay | Admitting: Podiatry

## 2023-10-30 ENCOUNTER — Ambulatory Visit (INDEPENDENT_AMBULATORY_CARE_PROVIDER_SITE_OTHER): Admitting: Podiatry

## 2023-10-30 DIAGNOSIS — L720 Epidermal cyst: Secondary | ICD-10-CM | POA: Insufficient documentation

## 2023-10-30 DIAGNOSIS — Z23 Encounter for immunization: Secondary | ICD-10-CM | POA: Insufficient documentation

## 2023-10-30 DIAGNOSIS — L600 Ingrowing nail: Secondary | ICD-10-CM | POA: Diagnosis not present

## 2023-10-30 DIAGNOSIS — D485 Neoplasm of uncertain behavior of skin: Secondary | ICD-10-CM | POA: Insufficient documentation

## 2023-10-30 DIAGNOSIS — L820 Inflamed seborrheic keratosis: Secondary | ICD-10-CM | POA: Insufficient documentation

## 2023-10-30 NOTE — Progress Notes (Signed)
 Subjective:   Patient ID: Tina Delacruz, female   DOB: 58 y.o.   MRN: [id number]   HPI Patient presents chronic ingrown toenail of the big toe both feet here for correction and is excited to get these fixed   ROS      Objective:  Physical Exam  Neuro vascular status intact incurvated lateral border right big toe medial border left big toe better painful when pressed and make shoe gear difficult with patient having tried to soak and trim them     Assessment:  Ingrown toenail deformity right hallux lateral border left hallux medial border with pain     Plan:  H&P reviewed recommended correction explained procedures to patient patient wants surgery understanding risk and today I infiltrated each big toe 60 mg Xylocaine Marcaine mixture sterile prep done to each toe and for the right hallux using sterile instrumentation removed the lateral border left hallux medial border removed the nailbed on the sides exposed matrix and applied phenol 3 applications 30 seconds followed by alcohol sterile dressing gave instructions on soaks wear dressing 24 hours taken off earlier if throbbing were to occur and encouraged to call with questions concerns

## 2023-10-30 NOTE — Patient Instructions (Signed)

## 2024-03-26 ENCOUNTER — Telehealth: Payer: Self-pay | Admitting: Podiatry

## 2024-03-26 NOTE — Telephone Encounter (Signed)
 Called and left a voicemail requesting a call back. Need to get her scheduled as an established patient ,she scheduled new patient through Mychart.

## 2024-03-28 ENCOUNTER — Telehealth: Payer: Self-pay

## 2024-03-28 NOTE — Telephone Encounter (Signed)
 Pt returning call per VM by Anabell. Time adjusted on 9/18 from 9:15a to 3:00 pm for 30 mins per note in appt details per Anabell.

## 2024-04-03 ENCOUNTER — Ambulatory Visit: Admitting: Podiatry

## 2024-04-04 ENCOUNTER — Ambulatory Visit

## 2024-04-04 ENCOUNTER — Ambulatory Visit (INDEPENDENT_AMBULATORY_CARE_PROVIDER_SITE_OTHER)

## 2024-04-04 DIAGNOSIS — L600 Ingrowing nail: Secondary | ICD-10-CM

## 2024-04-04 NOTE — Progress Notes (Signed)
 Subjective:  Patient ID: Tina Delacruz, female    DOB: 18-Feb-1966,  MRN: [id number]  Chief Complaint  Patient presents with   Nail Problem    Patient is here for F/U for ingrown nail removal of left hallux medial border patient states feels like nail is growing back when chemical was used     58 y.o. female presents with the above complaint.  Patient had bilateral hallux border permanent nail avulsion in April with Dr. Pasco Ovens, D.P.M.  Her right foot has done great and healed fully without pain.  Her left hallux toenail medial nail border has continued to be hypersensitive.  She denies any other complaints.   Review of Systems: Negative except as noted in the HPI. Denies N/V/F/Ch.  Past Medical History:  Diagnosis Date   Anxiety    Diabetes mellitus without complication (HCC)    type 2   Hypertension    Shortness of breath dyspnea    with bronchitis   Sleep apnea     Current Outpatient Medications:    aspirin 81 MG tablet, Take 81 mg by mouth daily., Disp: , Rfl:    Azelastine  HCl 0.15 % SOLN, Place 1 spray into both nostrils daily., Disp: 30 mL, Rfl: 5   Dulaglutide  (TRULICITY ) 4.5 MG/0.5ML SOPN, Inject 4.5 mg into the skin once a week., Disp: 6 mL, Rfl: 1   ezetimibe (ZETIA) 10 MG tablet, Take 10 mg by mouth daily., Disp: , Rfl:    FLUoxetine (PROZAC) 20 MG capsule, Take 20 mg by mouth daily., Disp: , Rfl:    hydrochlorothiazide (HYDRODIURIL) 25 MG tablet, Take 25 mg by mouth daily., Disp: , Rfl:    HYDROcodone -acetaminophen  (NORCO/VICODIN) 5-325 MG tablet, 1-2 tablets every 4-6 hours as needed for pain, Disp: 30 tablet, Rfl: 0   ibuprofen  (ADVIL ,MOTRIN ) 600 MG tablet, Take 1 tablet (600 mg total) by mouth every 6 (six) hours as needed., Disp: 90 tablet, Rfl: 0   LIRAGLUTIDE [place], Inject 1.8 mg into the skin., Disp: , Rfl:    montelukast (SINGULAIR) 10 MG tablet, Take 10 mg by mouth at bedtime., Disp: , Rfl:    ONETOUCH VERIO test strip, SMARTSIG:Strip(s), Disp: , Rfl:     OVER THE COUNTER MEDICATION, Take 1 tablet by mouth daily. Patient takes Aloe Vera Plus which contains Aloe Vera 300mg , Marshmellow root 25mg  and S[place]ppery [place]m bark 25mg , Disp: , Rfl:    telmisartan (MICARDIS) 80 MG tablet, Take 80 mg by mouth daily., Disp: , Rfl:    tirzepatide  (MOUNJARO ) 12.5 MG/0.5ML Pen, Inject 12.5 mg into the skin once a week., Disp: 2 mL, Rfl: 6  Social History   Tobacco Use  Smoking Status Former   Current packs/day: 0.00   Types: Cigarettes   Quit date: 01/10/1985   Years since quitting: 39.2  Smokeless Tobacco Not on file    Allergies  Allergen Reactions   Penicillins    Sulfa Antibiotics Rash   Tape Rash    20M and [place]   Objective:  There were no vitals filed for this visit. There is no height or weight on file to calculate BMI. Constitutional Well developed. Well nourished.  Vascular Dorsalis pedis pulses palpable bilaterally. Posterior tibial pulses palpable bilaterally. Capillary refill normal to all digits.  No cyanosis or clubbing noted. Pedal hair growth normal.  Neurologic Normal speech. Oriented to person, place, and time. Epicritic sensation to light touch grossly present bilaterally.  Dermatologic Left hallux medial border shows thickened skin that is growing under the medial  border of the nail.  No other open wounds. No skin lesions.  Orthopedic: Normal joint ROM without pain or crepitus bilaterally. No visible deformities. No bony tenderness.   Radiographs: None Assessment:   1. Ingrown nail    Plan:  Patient was evaluated and treated and all questions answered.  Compressed, sensitive skin to left hallux medial border - Discussed with the patient different treatment options for this.  We did discuss a formal permanent partial nail avulsion to the medial border to repeat what was done in April and give it a fresh started healing.  We also discussed gentle debridement of hypersensitive tissue. - Patient today wished to proceed  with debridement of hypersensitive tissue.  This was accomplished utilizing tissue nippers, debridement of skin layer only.  Following this debridement, the patient expressed satisfaction and a significant decrease in pain. - Patient is going to consider formal partial permanent nail avulsion of left hallux medial border, she will return to clinic as needed if she decides to go this route.   No follow-ups on file. Return to clinic as needed.   Prentice Ovens, DPM AACFAS [facility] Tra[facility] Podiatric Surgeon [facility]

## 2024-04-10 ENCOUNTER — Ambulatory Visit: Admitting: Podiatry

## 2024-04-11 ENCOUNTER — Ambulatory Visit: Admitting: Podiatry

## 2024-06-22 ENCOUNTER — Other Ambulatory Visit: Payer: Self-pay | Admitting: Medical Genetics

## 2024-09-05 ENCOUNTER — Other Ambulatory Visit (HOSPITAL_COMMUNITY): Payer: Self-pay
